# Patient Record
Sex: Female | Born: 1990 | Race: White | Hispanic: No | Marital: Single | Smoking: Never smoker
Health system: Southern US, Community
[De-identification: ages and names within clinical notes are randomized; demographics above are authoritative.]

## PROBLEM LIST (undated history)

## (undated) DIAGNOSIS — N3080 Other cystitis without hematuria: Secondary | ICD-10-CM

---

## 2015-02-23 ENCOUNTER — Emergency Department: Payer: Worker's Compensation

## 2015-02-23 ENCOUNTER — Emergency Department
Admission: EM | Admit: 2015-02-23 | Discharge: 2015-02-23 | Disposition: A | Discharge status: Home / Self Care | Payer: Worker's Compensation | Attending: Emergency Medicine | Admitting: Emergency Medicine

## 2015-02-23 DIAGNOSIS — Y998 Other external cause status: Secondary | ICD-10-CM | POA: Diagnosis not present

## 2015-02-23 DIAGNOSIS — S0081XA Abrasion of other part of head, initial encounter: Secondary | ICD-10-CM | POA: Insufficient documentation

## 2015-02-23 DIAGNOSIS — S0990XA Unspecified injury of head, initial encounter: Secondary | ICD-10-CM | POA: Insufficient documentation

## 2015-02-23 DIAGNOSIS — R55 Syncope and collapse: Secondary | ICD-10-CM | POA: Diagnosis not present

## 2015-02-23 DIAGNOSIS — Y9389 Activity, other specified: Secondary | ICD-10-CM | POA: Diagnosis not present

## 2015-02-23 DIAGNOSIS — Y9289 Other specified places as the place of occurrence of the external cause: Secondary | ICD-10-CM | POA: Insufficient documentation

## 2015-02-23 DIAGNOSIS — S80212A Abrasion, left knee, initial encounter: Secondary | ICD-10-CM | POA: Insufficient documentation

## 2015-02-23 DIAGNOSIS — S0003XA Contusion of scalp, initial encounter: Secondary | ICD-10-CM | POA: Insufficient documentation

## 2015-02-23 DIAGNOSIS — W1839XA Other fall on same level, initial encounter: Secondary | ICD-10-CM | POA: Insufficient documentation

## 2015-02-23 HISTORY — DX: Other cystitis without hematuria: N30.80

## 2015-02-23 LAB — CBC
HEMATOCRIT: 39.8 % (ref 35.0–47.0)
Hemoglobin: 12.4 g/dL (ref 12.0–16.0)
MCH: 25.6 pg — ABNORMAL LOW (ref 26.0–34.0)
MCHC: 31.1 g/dL — AB (ref 32.0–36.0)
MCV: 82.3 fL (ref 80.0–100.0)
Platelets: 402 10*3/uL (ref 150–440)
RBC: 4.83 MIL/uL (ref 3.80–5.20)
RDW: 16.1 % — AB (ref 11.5–14.5)
WBC: 10.5 10*3/uL (ref 3.6–11.0)

## 2015-02-23 LAB — URINALYSIS COMPLETE WITH MICROSCOPIC (ARMC ONLY)
BACTERIA UA: NONE SEEN
BILIRUBIN URINE: NEGATIVE
Glucose, UA: NEGATIVE mg/dL
Leukocytes, UA: NEGATIVE
NITRITE: NEGATIVE
PH: 5 (ref 5.0–8.0)
PROTEIN: 30 mg/dL — AB
Specific Gravity, Urine: 1.021 (ref 1.005–1.030)

## 2015-02-23 LAB — BASIC METABOLIC PANEL
Anion gap: 13 (ref 5–15)
BUN: 10 mg/dL (ref 6–20)
CALCIUM: 8.9 mg/dL (ref 8.9–10.3)
CO2: 18 mmol/L — ABNORMAL LOW (ref 22–32)
Chloride: 108 mmol/L (ref 101–111)
Creatinine, Ser: 1.22 mg/dL — ABNORMAL HIGH (ref 0.44–1.00)
GFR calc Af Amer: >60 mL/min (ref >60)
GLUCOSE: 82 mg/dL (ref 65–99)
POTASSIUM: 4 mmol/L (ref 3.5–5.1)
Sodium: 139 mmol/L (ref 135–145)

## 2015-02-23 MED ORDER — SODIUM CHLORIDE 0.9 % IV SOLN
Freq: Once | INTRAVENOUS | Status: AC
  Administered 2015-02-23: 13:00:00 via INTRAVENOUS

## 2015-02-23 NOTE — Discharge Instructions (Signed)
Cardiac Event Monitoring A cardiac event monitor is a small recording device used to help detect abnormal heart rhythms (arrhythmias). The monitor is used to record heart rhythm when noticeable symptoms such as the following occur:  Fast heartbeats (palpitations), such as heart racing or fluttering.  Dizziness.  Fainting or light-headedness.  Unexplained weakness. The monitor is wired to two electrodes placed on your chest. Electrodes are flat, sticky disks that attach to your skin. The monitor can be worn for up to 30 days. You will wear the monitor at all times, except when bathing.  HOW TO USE YOUR CARDIAC EVENT MONITOR A technician will prepare your chest for the electrode placement. The technician will show you how to place the electrodes, how to work the monitor, and how to replace the batteries. Take time to practice using the monitor before you leave the office. Make sure you understand how to send the information from the monitor to your health care provider. This requires a telephone with a landline, not a cell phone. You need to:  Wear your monitor at all times, except when you are in water:  Do not get the monitor wet.  Take the monitor off when bathing. Do not swim or use a hot tub with it on.  Keep your skin clean. Do not put body lotion or moisturizer on your chest.  Change the electrodes daily or any time they stop sticking to your skin. You might need to use tape to keep them on.  It is possible that your skin under the electrodes could become irritated. To keep this from happening, try to put the electrodes in slightly different places on your chest. However, they must remain in the area under your left breast and in the upper right section of your chest.  Make sure the monitor is safely clipped to your clothing or in a location close to your body that your health care provider recommends.  Press the button to record when you feel symptoms of heart trouble, such as  dizziness, weakness, light-headedness, palpitations, thumping, shortness of breath, unexplained weakness, or a fluttering or racing heart. The monitor is always on and records what happened slightly before you pressed the button, so do not worry about being too late to get good information.  Keep a diary of your activities, such as walking, doing chores, and taking medicine. It is especially important to note what you were doing when you pushed the button to record your symptoms. This will help your health care provider determine what might be contributing to your symptoms. The information stored in your monitor will be reviewed by your health care provider alongside your diary entries.  Send the recorded information as recommended by your health care provider. It is important to understand that it will take some time for your health care provider to process the results.  Change the batteries as recommended by your health care provider. SEEK IMMEDIATE MEDICAL CARE IF:   You have chest pain.  You have extreme difficulty breathing or shortness of breath.  You develop a very fast heartbeat that persists.  You develop dizziness that does not go away.  You faint or constantly feel you are about to faint.   This information is not intended to replace advice given to you by your health care provider. Make sure you discuss any questions you have with your health care provider.   Syncope Syncope is a medical term for fainting or passing out. This means you lose consciousness and  drop to the ground. People are generally unconscious for less than 5 minutes. You may have some muscle twitches for up to 15 seconds before waking up and returning to normal. Syncope occurs more often in older adults, but it can happen to anyone. While most causes of syncope are not dangerous, syncope can be a sign of a serious medical problem. It is important to seek medical care.  CAUSES  Syncope is caused by a sudden drop  in blood flow to the brain. The specific cause is often not determined. Factors that can bring on syncope include:  Taking medicines that lower blood pressure.  Sudden changes in posture, such as standing up quickly.  Taking more medicine than prescribed.  Standing in one place for too long.  Seizure disorders.  Dehydration and excessive exposure to heat.  Low blood sugar (hypoglycemia).  Straining to have a bowel movement.  Heart disease, irregular heartbeat, or other circulatory problems.  Fear, emotional distress, seeing blood, or severe pain. SYMPTOMS  Right before fainting, you may:  Feel dizzy or light-headed.  Feel nauseous.  See all white or all black in your field of vision.  Have cold, clammy skin. DIAGNOSIS  Your health care provider will ask about your symptoms, perform a physical exam, and perform an electrocardiogram (ECG) to record the electrical activity of your heart. Your health care provider may also perform other heart or blood tests to determine the cause of your syncope which may include:  Transthoracic echocardiogram (TTE). During echocardiography, sound waves are used to evaluate how blood flows through your heart.  Transesophageal echocardiogram (TEE).  Cardiac monitoring. This allows your health care provider to monitor your heart rate and rhythm in real time.  Holter monitor. This is a portable device that records your heartbeat and can help diagnose heart arrhythmias. It allows your health care provider to track your heart activity for several days, if needed.  Stress tests by exercise or by giving medicine that makes the heart beat faster. TREATMENT  In most cases, no treatment is needed. Depending on the cause of your syncope, your health care provider may recommend changing or stopping some of your medicines. HOME CARE INSTRUCTIONS  Have someone stay with you until you feel stable.  Do not drive, use machinery, or play sports until your  health care provider says it is okay.  Keep all follow-up appointments as directed by your health care provider.  Lie down right away if you start feeling like you might faint. Breathe deeply and steadily. Wait until all the symptoms have passed.  Drink enough fluids to keep your urine clear or pale yellow.  If you are taking blood pressure or heart medicine, get up slowly and take several minutes to sit and then stand. This can reduce dizziness. SEEK IMMEDIATE MEDICAL CARE IF:   You have a severe headache.  You have unusual pain in the chest, abdomen, or back.  You are bleeding from your mouth or rectum, or you have black or tarry stool.  You have an irregular or very fast heartbeat.  You have pain with breathing.  You have repeated fainting or seizure-like jerking during an episode.  You faint when sitting or lying down.  You have confusion.  You have trouble walking.  You have severe weakness.  You have vision problems. If you fainted, call your local emergency services (911 in [place] Do not drive yourself to the hospital.    This information is not intended to replace advice given  to you by your health care provider. Make sure you discuss any questions you have with your health care provider.   Document Released: 03/20/2005 Document Revised: 08/04/2014 Document Reviewed: 05/19/2011 Elsevier Interactive Patient Education 2016 [employer]  Head Injury, Adult You have a head injury. Headaches and throwing up (vomiting) are common after a head injury. It should be easy to wake up from sleeping. Sometimes you must stay in the hospital. Most problems happen within the first 24 hours. Side effects may occur up to 7-10 days after the injury.  WHAT ARE THE TYPES OF HEAD INJURIES? Head injuries can be as minor as a bump. Some head injuries can be more severe. More severe head injuries include:  A jarring injury to the brain (concussion).  A bruise of the brain (contusion).  This mean there is bleeding in the brain that can cause swelling.  A cracked skull (skull fracture).  Bleeding in the brain that collects, clots, and forms a bump (hematoma). WHEN SHOULD I GET HELP RIGHT AWAY?   You are confused or sleepy.  You cannot be woken up.  You feel sick to your stomach (nauseous) or keep throwing up (vomiting).  Your dizziness or unsteadiness is getting worse.  You have very bad, lasting headaches that are not helped by medicine. Take medicines only as told by your doctor.  You cannot use your arms or legs like normal.  You cannot walk.  You notice changes in the black spots in the center of the colored part of your eye (pupil).  You have clear or bloody fluid coming from your nose or ears.  You have trouble seeing. During the next 24 hours after the injury, you must stay with someone who can watch you. This person should get help right away (call 911 in the [place] if you start to shake and are not able to control it (have seizures), you pass out, or you are unable to wake up. HOW CAN I PREVENT A HEAD INJURY IN THE FUTURE?  Wear seat belts.  Wear a helmet while bike riding and playing sports like football.  Stay away from dangerous activities around the house. WHEN CAN I RETURN TO NORMAL ACTIVITIES AND ATHLETICS? See your doctor before doing these activities. You should not do normal activities or play contact sports until 1 week after the following symptoms have stopped:  Headache that does not go away.  Dizziness.  Poor attention.  Confusion.  Memory problems.  Sickness to your stomach or throwing up.  Tiredness.  Fussiness.  Bothered by bright lights or loud noises.  Anxiousness or depression.  Restless sleep. MAKE SURE YOU:   Understand these instructions.  Will watch your condition.  Will get help right away if you are not doing well or get worse.   This information is not intended to replace advice given to you by your  health care provider. Make sure you discuss any questions you have with your health care provider.   Document Released: 03/02/2008 Document Revised: 04/10/2014 Document Reviewed: 11/25/2012 Elsevier Interactive Patient Education 2016 Elsevier Inc.  Document Released: 12/28/2007 Document Revised: 04/10/2014 Document Reviewed: 09/16/2012 Elsevier Interactive Patient Education [employer]

## 2015-02-23 NOTE — ED Notes (Signed)
Patient transported to CT 

## 2015-02-23 NOTE — ED Notes (Signed)
Pt bib EMS w/ c/o LOC this AM.  Per EMS, pt was practicing drills when she passed out.  Per EMS, pt pale at scene.  Pt does have lac to L forehead.  Pt as had 2 previous seizures, in 2013 and 2014.  Pt sts that she does not take medications for seizures.  Per EMS, pt was not postichtal and not confused on scene.  Pt A/O x4.

## 2015-02-23 NOTE — ED Provider Notes (Signed)
[facility] Emergency Department Provider Note     Time seen: ----------------------------------------- 12:28 PM on 02/23/2015 -----------------------------------------    I have reviewed the triage vital signs and the nursing notes.   HISTORY  Chief Complaint Loss of Consciousness    HPI Leslie Murray is a 24 y.o. female who presents to ER after loss of consciousness this morning. According to EMS she was practicing drills when she passed out. She was pale on the scene, there was no noted seizure activity. She did have abrasions to her left side of her forehead. She does not take any medications for seizures, again there was no shaking observed and she was not postictal or confused. She also denies incontinence. Complaint is abrasions to the left frontal scalp and left knee.   Past Medical History  Diagnosis Date  . Polypoid cystitis     There are no active problems to display for this patient.   History reviewed. No pertinent past surgical history.  Allergies Review of patient's allergies indicates no known allergies.  Social History Social History  Substance Use Topics  . Smoking status: Never Smoker   . Smokeless tobacco: None  . Alcohol Use: No    Review of Systems Constitutional: Negative for fever. Eyes: Negative for visual changes. ENT: Negative for sore throat. Cardiovascular: Negative for chest pain. Respiratory: Negative for shortness of breath. Gastrointestinal: Negative for abdominal pain, vomiting and diarrhea. Genitourinary: Negative for dysuria. Musculoskeletal: Negative for back pain. Skin: Positive for left frontal scalp abrasion and left knee abrasion Neurological: Negative for headaches, focal weakness or numbness.  10-point ROS otherwise negative.  ____________________________________________   PHYSICAL EXAM:  VITAL SIGNS: ED Triage Vitals  Enc Vitals Group     BP 02/23/15 1148 116/78 mmHg     Pulse  Rate 02/23/15 1148 102     Resp 02/23/15 1148 20     Temp 02/23/15 1148 98.5 F (36.9 C)     Temp Source 02/23/15 1148 Oral     SpO2 02/23/15 1148 98 %     Weight 02/23/15 1148 261 lb (118.389 kg)     Height 02/23/15 1148  (1.6 m)     Head Cir --      Peak Flow --      Pain Score 02/23/15 1148 3     Pain Loc --      Pain Edu? --      Excl. in GC? --     Constitutional: Alert and oriented. Well appearing and in no distress. Eyes: Conjunctivae are normal. PERRL. Normal extraocular movements. ENT   Head: Left frontal and left temporal scalp hematoma with extensive abrasion and contusion extends into the left zygoma   Nose: No congestion/rhinnorhea.   Mouth/Throat: Mucous membranes are moist.   Neck: No stridor. Cardiovascular: Normal rate, regular rhythm. Normal and symmetric distal pulses are present in all extremities. No murmurs, rubs, or gallops. Respiratory: Normal respiratory effort without tachypnea nor retractions. Breath sounds are clear and equal bilaterally. No wheezes/rales/rhonchi. Gastrointestinal: Soft and nontender. No distention. No abdominal bruits.  Musculoskeletal: Nontender with normal range of motion in all extremities. No joint effusions.  No lower extremity tenderness nor edema. Neurologic:  Normal speech and language. No gross focal neurologic deficits are appreciated. Speech is normal. No gait instability. Skin:  Facial abrasions and contusions, slight abrasion over the left patella Psychiatric: Mood and affect are normal. Speech and behavior are normal. Patient exhibits appropriate insight and judgment. ____________________________________________  EKG: Interpreted by  me. Sinus tachycardia with a rate of 103 bpm, normal PR interval, normal QS with, normal QT interval. Nonspecific T-wave changes.  ____________________________________________  ED COURSE:  Pertinent labs & imaging results that were available during my care of the patient  were reviewed by me and considered in my medical decision making (see chart for details). This is likely syncope rather than a seizure event. Patient will need basic labs and EKG. ____________________________________________    LABS (pertinent positives/negatives)  Labs Reviewed  BASIC METABOLIC PANEL - Abnormal; Notable for the following:    CO2 18 (*)    Creatinine, Ser 1.22 (*)    All other components within normal limits  CBC - Abnormal; Notable for the following:    MCH 25.6 (*)    MCHC 31.1 (*)    RDW 16.1 (*)    All other components within normal limits  URINALYSIS COMPLETEWITH MICROSCOPIC ([facility] ONLY) - Abnormal; Notable for the following:    Color, Urine YELLOW (*)    APPearance HAZY (*)    Ketones, ur TRACE (*)    Hgb urine dipstick 1+ (*)    Protein, ur 30 (*)    Squamous Epithelial / LPF 0-5 (*)    All other components within normal limits  CBG MONITORING, ED    RADIOLOGY Images were viewed by me  CT head  IMPRESSION: Left face contusion without fracture or intracranial injury. ____________________________________________  FINAL ASSESSMENT AND PLAN  Syncope, minor head injury  Plan: Patient with labs and imaging as dictated above. Patient sick a possible has a history of same. Advised she needs to be cleared by cardiology before she can return to physical activity. She will likely need an event monitoring by cardiologist to ensure she is not having arrhythmias. She is in no acute distress this time.   Emily FilbertWilliams, Jonathan E, MD   Emily FilbertJonathan E Williams, MD 02/23/15 [phone]

## 2016-05-18 IMAGING — CT CT HEAD W/O CM
5 of 6 series · 18 of 47 positions shown, 19 images · non-contrast
Comparison: None

CLINICAL DATA: Fall with forehead laceration.  Initial encounter.

EXAM:
CT HEAD WITHOUT CONTRAST
CT MAXILLOFACIAL WITHOUT CONTRAST
TECHNIQUE: Multidetector CT imaging of the head and maxillofacial structures
were performed using the standard protocol without intravenous
contrast. Multiplanar CT image reconstructions of the maxillofacial
structures were also generated.

[Series 2: head wo · axial · 0.38mm/px · z∈[-53,-5]mm · 2 of 30 slices shown (1 of 2)]
[im 10/30  brain]
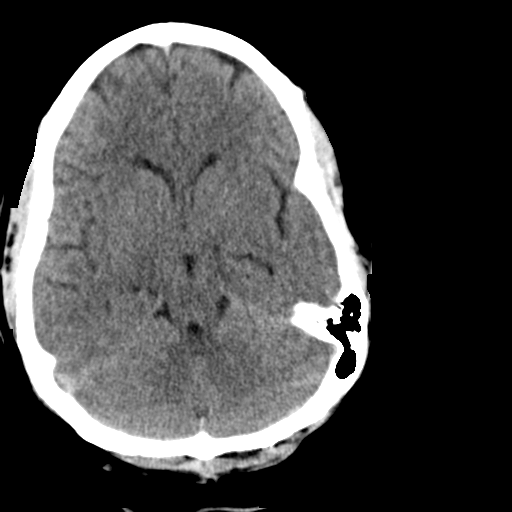
[im 20/30  brain]
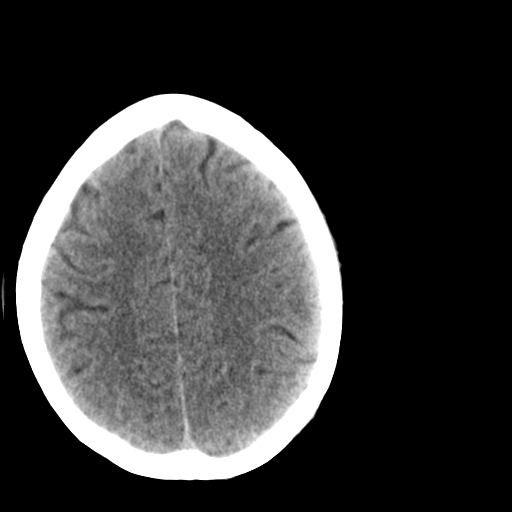

[Series 4: head wo · axial · 0.40mm/px · z∈[-62,+5]mm · 3 of 30 slices shown, 4 images (2 of 2)]
[im 8/30  brain]
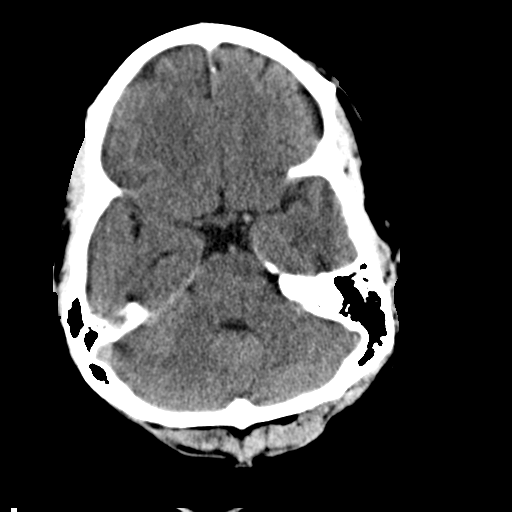
[im 8/30  bone]
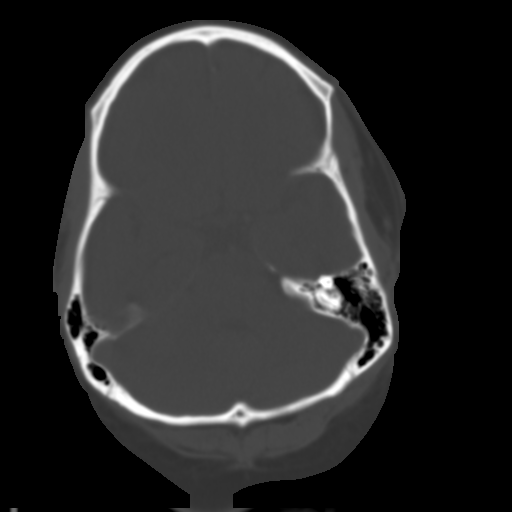
[im 15/30  brain]
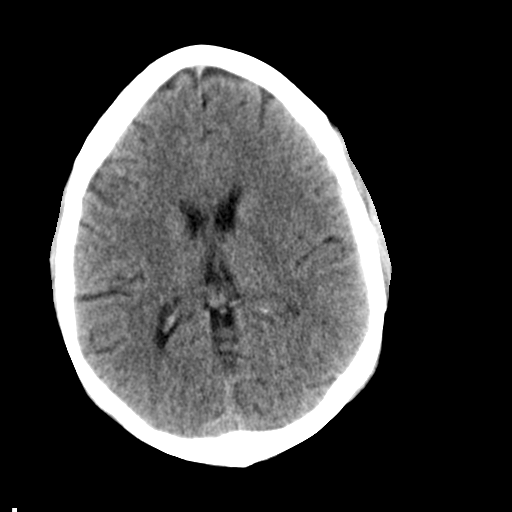
[im 22/30  brain]
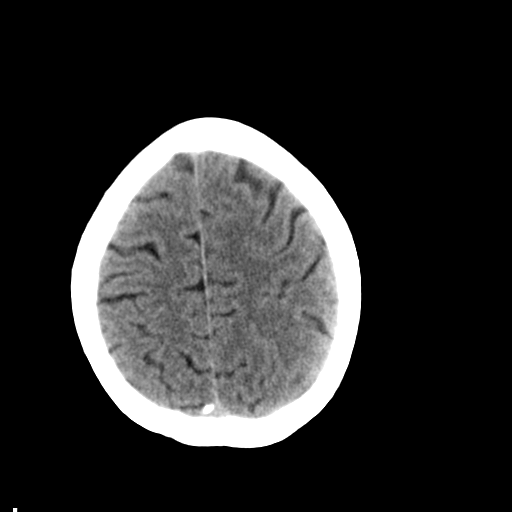

[Series 6: max bone · axial · 0.34mm/px · z∈[-208,-100]mm · 7 of 75 slices shown]
[im 7/75  bone]
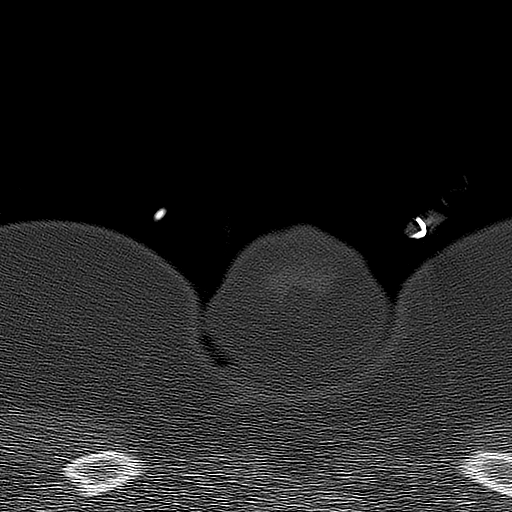
[im 14/75  bone]
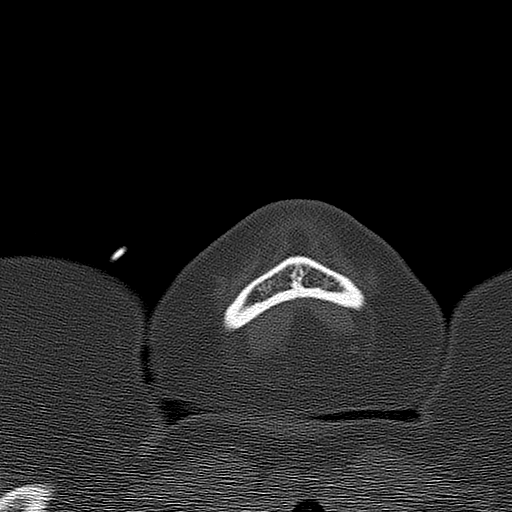
[im 27/75  bone]
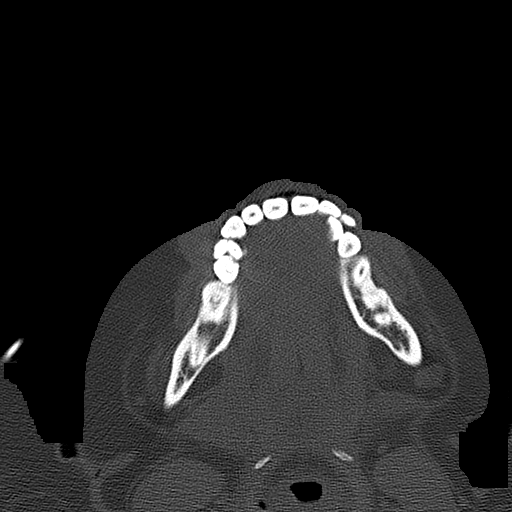
[im 34/75  bone]
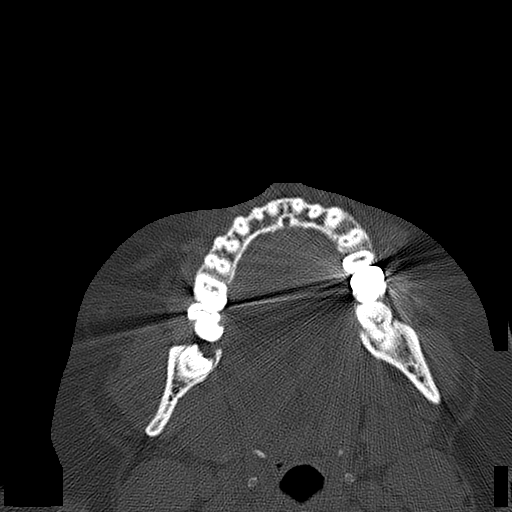
[im 41/75  bone]
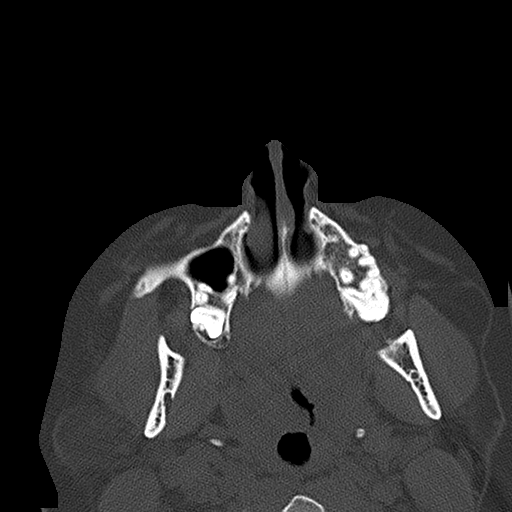
[im 48/75  bone]
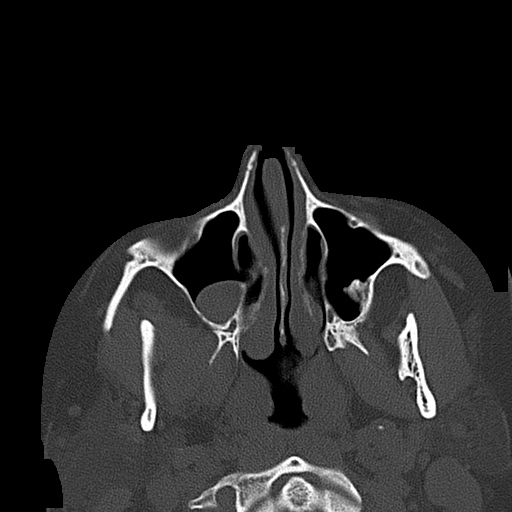
[im 61/75  bone]
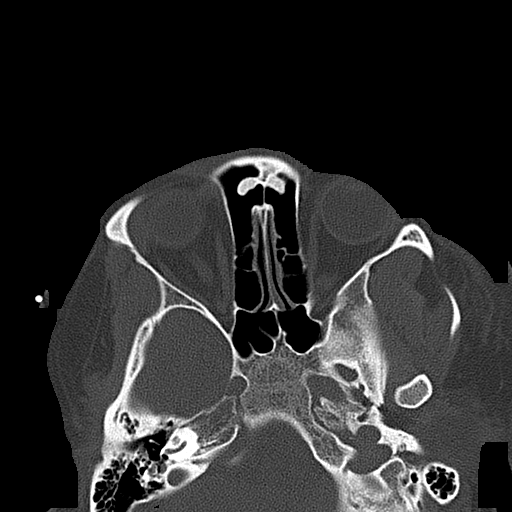

[Series 7: coronal soft · coronal · 0.29mm/px · 3 of 81 slices shown]
[im 27/81  brain]
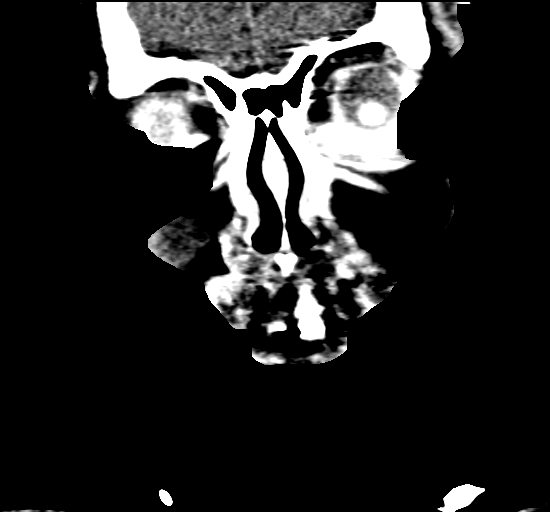
[im 36/81  brain]
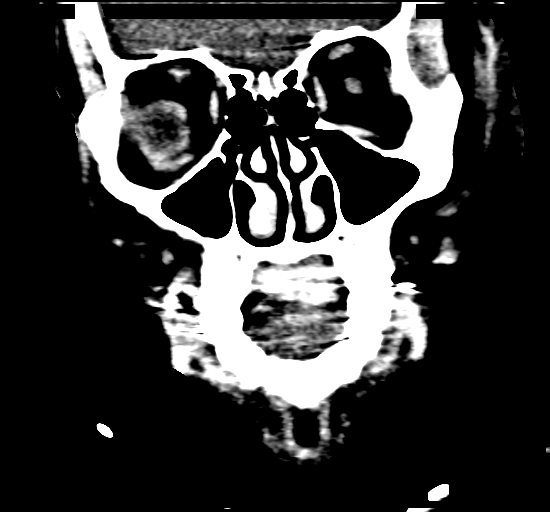
[im 45/81  brain]
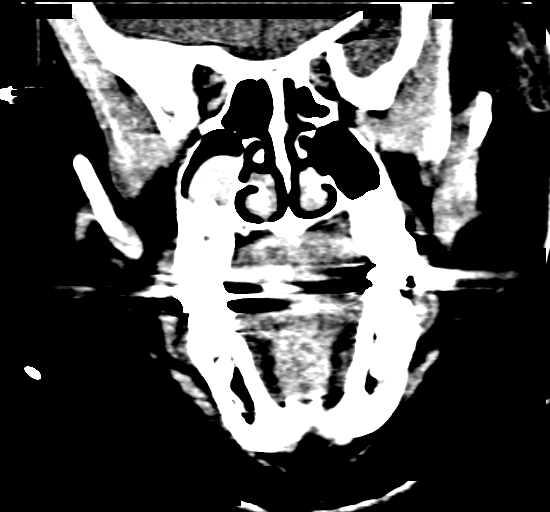

[Series 8: sagittal soft · sagittal · 0.30mm/px · 3 of 83 slices shown]
[im 28/83  brain]
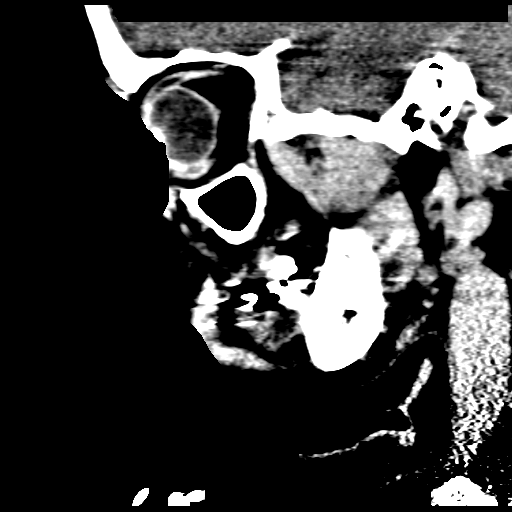
[im 42/83  brain]
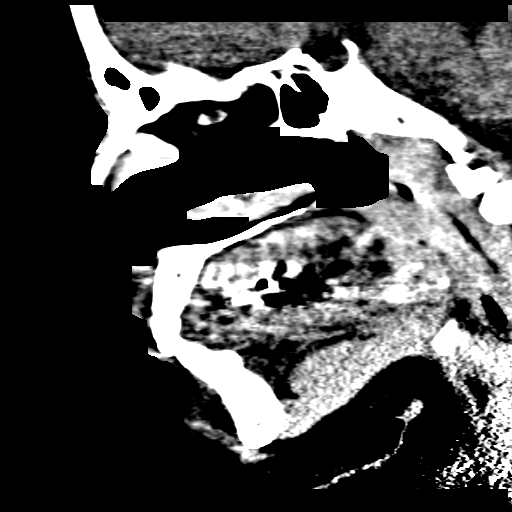
[im 55/83  brain]
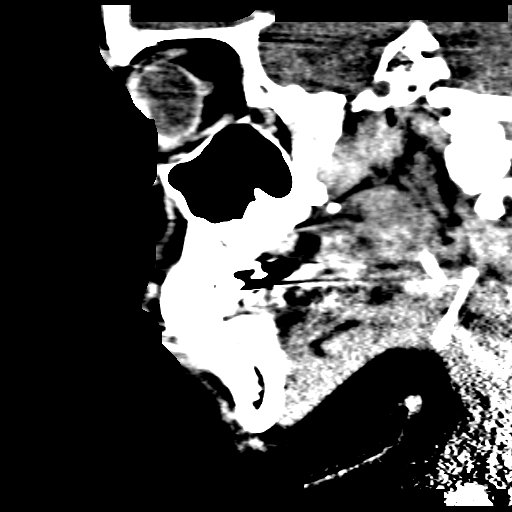

[18 of 47 positions shown; findings below may reference images not displayed]

FINDINGS: CT HEAD FINDINGS

Skull and Sinuses:Cutaneous swelling around the left temporal fossa
without fracture or opaque foreign body.

Orbits: See below

Brain: Normal. No evidence of acute infarction, hemorrhage,
hydrocephalus, or mass lesion/mass effect.

CT MAXILLOFACIAL FINDINGS

Soft tissue swelling about the left zygoma and temporal fossa
without opaque foreign body facial fracture. No evidence of globe
injury or postseptal hematoma.

Smoothly contoured soft tissue the posterior right maxillary antrum
consistent with mucous retention cysts\. Closely neighboring
unerupted right wisdom tooth roots are likely unrelated.
IMPRESSION: Left face contusion without fracture or intracranial injury.
